# Patient Record
Sex: Male | Born: 1995 | Race: White | Hispanic: No | Marital: Single | Smoking: Current every day smoker
Health system: Southern US, Community
[De-identification: ages and names within clinical notes are randomized; demographics above are authoritative.]

## PROBLEM LIST (undated history)

## (undated) DIAGNOSIS — J45909 Unspecified asthma, uncomplicated: Secondary | ICD-10-CM

## (undated) DIAGNOSIS — Z800 Family history of malignant neoplasm of digestive organs: Secondary | ICD-10-CM

## (undated) DIAGNOSIS — Z803 Family history of malignant neoplasm of breast: Secondary | ICD-10-CM

## (undated) HISTORY — DX: Family history of malignant neoplasm of digestive organs: Z80.0

## (undated) HISTORY — DX: Family history of malignant neoplasm of breast: Z80.3

---
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
## 2015-03-12 IMAGING — CR DG CHEST 2V
3 series · 3 of 3 positions shown · non-contrast
Comparison: DG THORACIC SPINE W/SWIMMERS dated 05/03/2008; DG CHEST
2 VIEW dated 01/04/2008

CLINICAL DATA: Left-sided chest pain.  History of tobacco use

EXAM:
CHEST  2 VIEW

[w chest pa (1 of 2)]
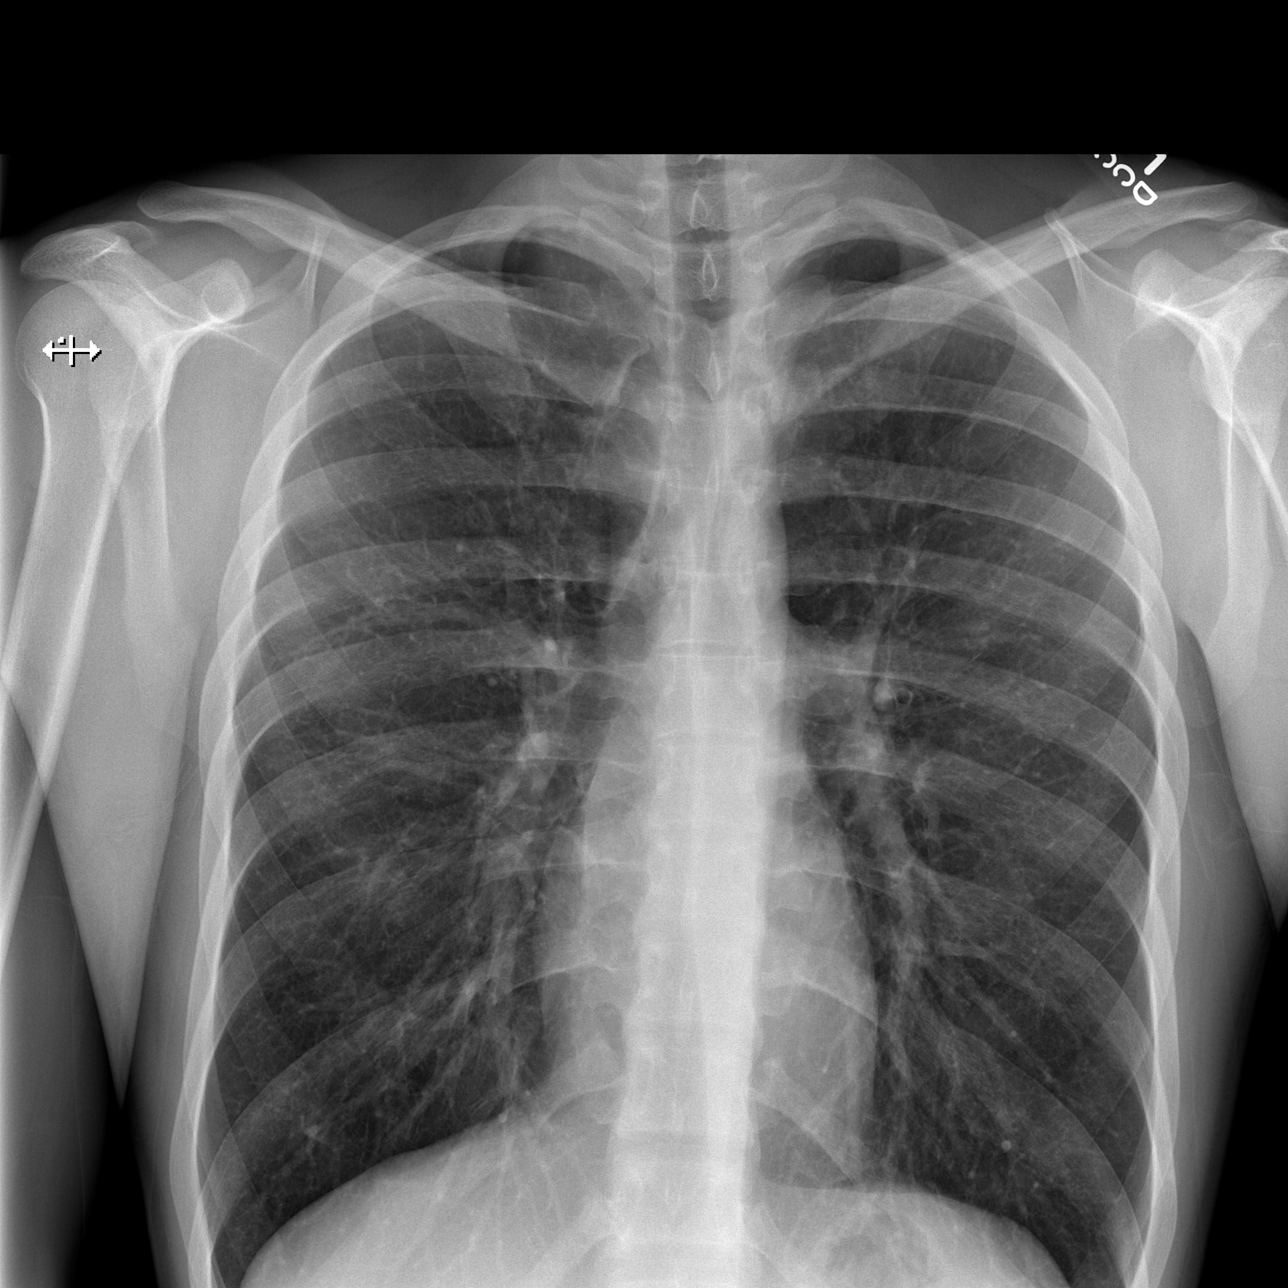

[w chest pa (2 of 2)]
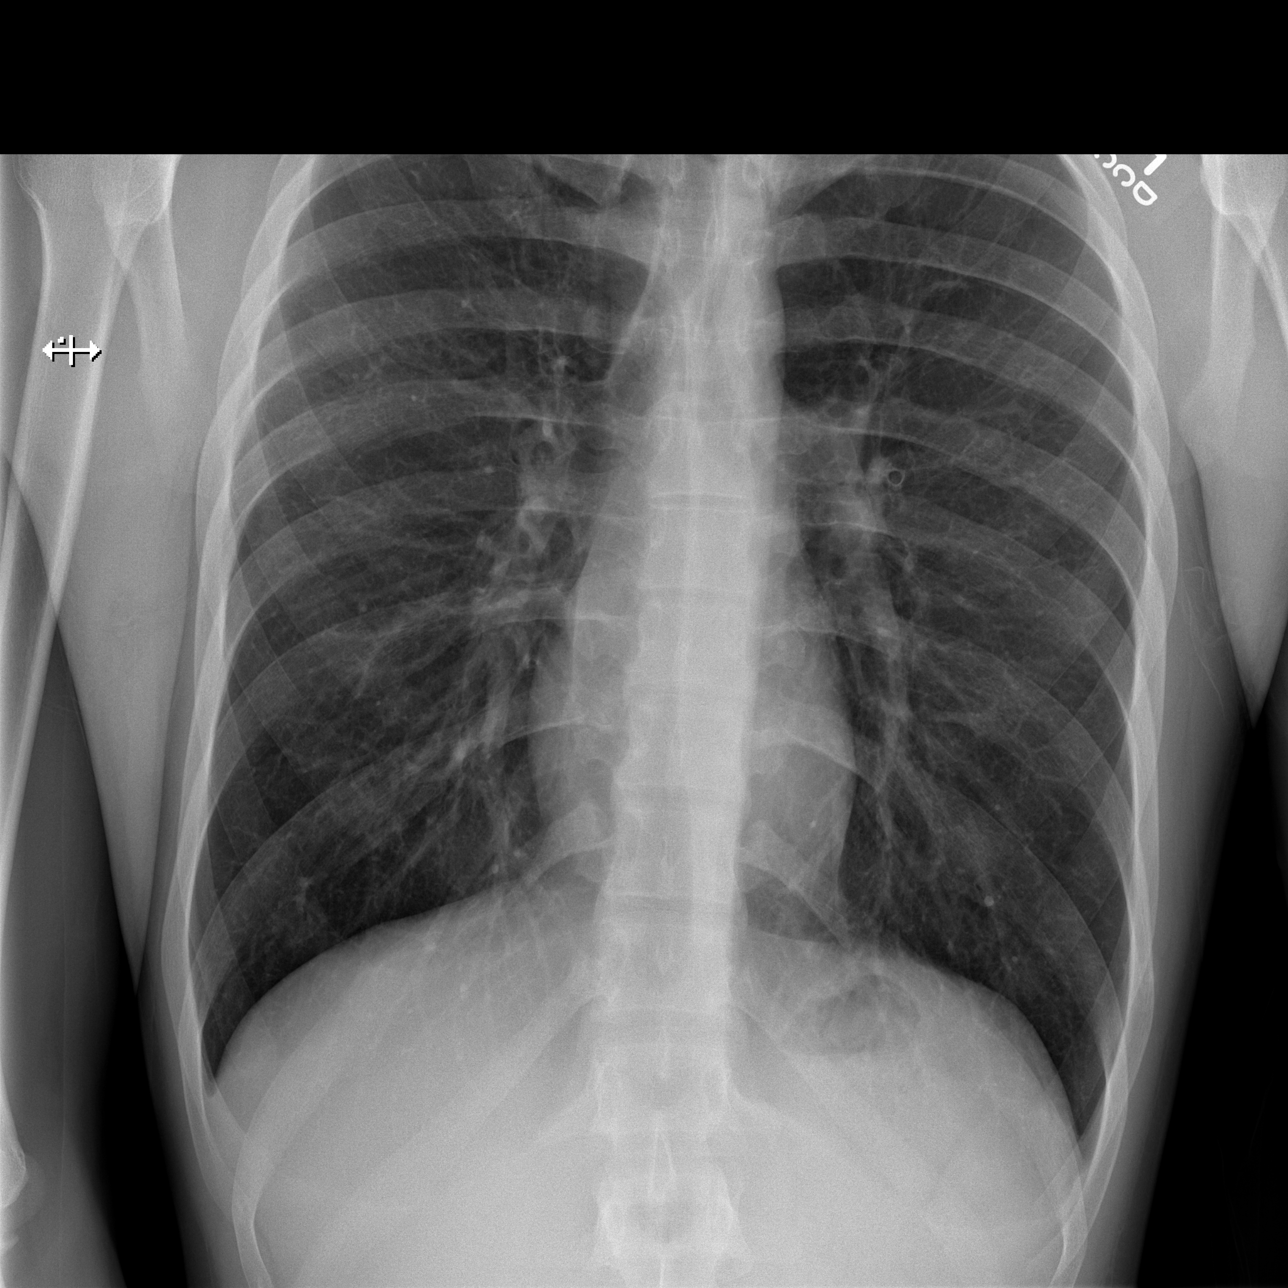

[w chest lat]
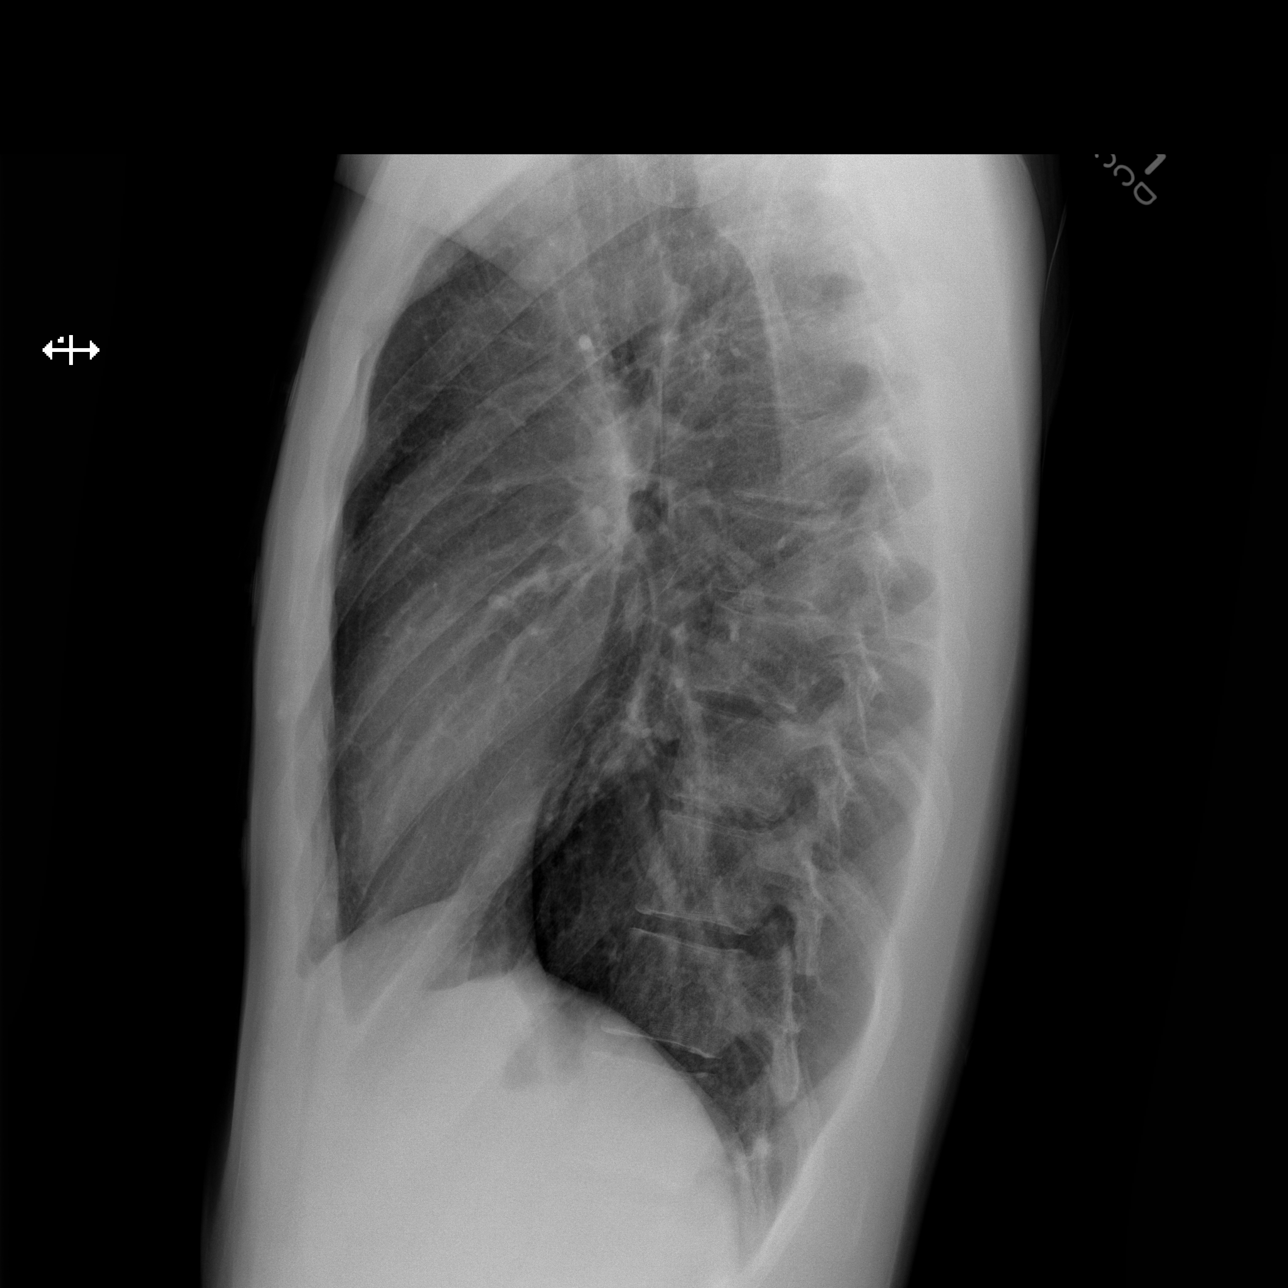

[3 of 3 positions shown; findings below may reference images not displayed]

FINDINGS: The lungs are chronically hyperinflated. There is no focal
infiltrate. There is no pleural effusion or pneumothorax or
pneumomediastinum. The cardiac silhouette is normal in size. The
pulmonary vascularity is not engorged. The mediastinum is normal in
width. The observed portions of the bony thorax appear normal.
IMPRESSION: There is hyperinflation consistent with reactive airway disease.
There is no evidence of pneumonia nor CHF or other acute
cardiopulmonary abnormality.

## 2018-12-12 ENCOUNTER — Other Ambulatory Visit: Payer: Self-pay

## 2018-12-12 DIAGNOSIS — Z20822 Contact with and (suspected) exposure to covid-19: Secondary | ICD-10-CM

## 2018-12-14 LAB — NOVEL CORONAVIRUS, NAA: SARS-CoV-2, NAA: NOT DETECTED

## 2019-01-17 ENCOUNTER — Other Ambulatory Visit: Payer: Self-pay

## 2019-01-17 DIAGNOSIS — Z20822 Contact with and (suspected) exposure to covid-19: Secondary | ICD-10-CM

## 2019-01-18 LAB — NOVEL CORONAVIRUS, NAA: SARS-CoV-2, NAA: NOT DETECTED

## 2019-09-25 ENCOUNTER — Inpatient Hospital Stay: Payer: Self-pay | Attending: Oncology

## 2019-09-25 ENCOUNTER — Encounter: Payer: Self-pay | Admitting: Genetic Counselor

## 2019-09-25 ENCOUNTER — Ambulatory Visit (HOSPITAL_BASED_OUTPATIENT_CLINIC_OR_DEPARTMENT_OTHER): Payer: Medicaid Other | Admitting: Genetic Counselor

## 2019-09-25 ENCOUNTER — Other Ambulatory Visit: Payer: Self-pay | Admitting: Genetic Counselor

## 2019-09-25 DIAGNOSIS — Z803 Family history of malignant neoplasm of breast: Secondary | ICD-10-CM | POA: Insufficient documentation

## 2019-09-25 DIAGNOSIS — Z800 Family history of malignant neoplasm of digestive organs: Secondary | ICD-10-CM | POA: Insufficient documentation

## 2019-09-25 NOTE — Progress Notes (Signed)
REFERRING PROVIDER: Mila Palmer, MD [address] [place],  [place] [address]  PRIMARY PROVIDER:  No primary care provider on file.  PRIMARY REASON FOR VISIT:  1. Family history of breast cancer   2. Family history of Lynch syndrome      HISTORY OF PRESENT ILLNESS:   Edward Hudson, a 24 y.o. male, was seen for a [facility] cancer genetics consultation at the request of Dr. Paulino Rily due to a family history of breast cancer and a diagnosis of Lynch syndrome in his maternal grandmother.  Edward Hudson presents to clinic today to discuss the possibility of a hereditary predisposition to cancer, genetic testing, and to further clarify his future cancer risks, as well as potential cancer risks for family members.   Edward Hudson is a 24 y.o. male with no personal history of cancer.  He attended today's visit with his grandmother who was receiving her test results of a diagnosis of Lynch syndrome.   CANCER HISTORY:  Oncology History   No history exists.     Past Medical History:  Diagnosis Date  . Asthma   . Family history of breast cancer   . Family history of Lynch syndrome     No past surgical history on file.  Social History   Socioeconomic History  . Marital status: Single    Spouse name: Not on file  . Number of children: Not on file  . Years of education: Not on file  . Highest education level: Not on file  Occupational History  . Not on file  Tobacco Use  . Smoking status: Current Every Day Smoker  Substance and Sexual Activity  . Alcohol use: No  . Drug use: Not on file  . Sexual activity: Not on file  Other Topics Concern  . Not on file  Social History Narrative  . Not on file   Social Determinants of Health   Financial Resource Strain:   . Difficulty of Paying Living Expenses:   Food Insecurity:   . Worried About Programme researcher, broadcasting/film/video in the Last Year:   . Barista in the Last Year:   Transportation Needs:   . Freight forwarder  (Medical):   Marland Kitchen Lack of Transportation (Non-Medical):   Physical Activity:   . Days of Exercise per Week:   . Minutes of Exercise per Session:   Stress:   . Feeling of Stress :   Social Connections:   . Frequency of Communication with Friends and Family:   . Frequency of Social Gatherings with Friends and Family:   . Attends Religious Services:   . Active Member of Clubs or Organizations:   . Attends Banker Meetings:   Marland Kitchen Marital Status:      FAMILY HISTORY:  We obtained a detailed, 4-generation family history.  Significant diagnoses are listed below: Family History  Problem Relation Age of Onset  . Breast cancer Maternal Grandmother 71       Metastatic breast cancer - MSH6+  . Heart disease Paternal Grandfather 25    The patient does not have children.  He has a paternal half brother who is cancer free.  Both parents are living.  The patient's mother is 13 and cancer free.  She is an only child.  Her mother was diagnosed with metastatic breast cancer at age 63.  Her mother also had breast cancer at age 71.  Genetic testing on his grandmother found an MSH6 pathogenic variant.  The  patient's father is living at 71.  The patient' does not know much information on his father's family.  He has three sisters but their health is unknown.  His mother is living and his father is deceased.  Edward Hudson is aware of previous family history of genetic testing for hereditary cancer risks. Patient's maternal ancestors are of English/Irish/Portuguese descent, and paternal ancestors are of Caucasian descent. There is no reported Ashkenazi Jewish ancestry. There is no known consanguinity.  GENETIC COUNSELING ASSESSMENT: Edward Hudson is a 24 y.o. male with a family history of cancer which is somewhat suggestive of a diagnosis of Lynch syndrome and predisposition to cancer given his grandmother's MSH6 pathogenic variant. We, therefore, discussed and recommended the following at today's visit.    DISCUSSION: Edward Hudson grandmother tested positive for Lynch syndrome based on a genetic variant in MSH6.  Based on her test result, Edward Hudson has a 25% chance of also testing positive.  We discussed Lynch syndrome and the screening that would be needed should he test positive.  IF he should test positive, we discussed that it would mean that his mother would also be positive.   We discussed that testing is beneficial for several reasons including knowing how to follow individuals and understand if other family members could be at risk for cancer and allow them to undergo genetic testing.   We reviewed the characteristics, features and inheritance patterns of hereditary cancer syndromes. We also discussed genetic testing, including the appropriate family members to test, the process of testing, insurance coverage and turn-around-time for results. We discussed the implications of a negative, positive, carrier and/or variant of uncertain significant result. We recommended Edward Hudson pursue genetic testing for the MSH6 and PDGFRA variants identified in his grandmother.     Based on Edward Hudson's family history of cancer, he meets medical criteria for genetic testing. He should not have an out of pocket cost for testing as he is getting tested through [facility]'s family variant testing program and he is within the 557 day policy period.  PLAN: After considering the risks, benefits, and limitations, Edward Hudson provided informed consent to pursue genetic testing and the blood sample was sent to [facility] for analysis of the MSH6 and PDGFRA genes. Results should be available within approximately 2-3 weeks' time, at which point they will be disclosed by telephone to Edward Hudson, as will any additional recommendations warranted by these results. Edward Hudson will receive a summary of his genetic counseling visit and a copy of his results once available. This information will also be available in Epic.   Lastly, we  encouraged Edward Hudson to remain in contact with cancer genetics annually so that we can continuously update the family history and inform him of any changes in cancer genetics and testing that may be of benefit for this family.   Edward Hudson's questions were answered to his satisfaction today. Our contact information was provided should additional questions or concerns arise. Thank you for the referral and allowing [place] to share in the care of your patient.   Jessice Madill P. Florene Glen, [place], [facility] Licensed, Insurance risk surveyor [email or web address] phone: [phone]  The patient was seen for a total of 30 minutes in face-to-face genetic counseling.  This patient was discussed with Drs. Magrinat, Lindi Adie and/or Burr Medico who agrees with the above.    _______________________________________________________________________ For Office Staff:  Number of people involved in session: 2 Was an Intern/ student involved with case: no

## 2019-09-26 LAB — GENETIC SCREENING ORDER

## 2019-10-05 ENCOUNTER — Encounter: Payer: Self-pay | Admitting: Genetic Counselor

## 2019-10-05 DIAGNOSIS — Z1379 Encounter for other screening for genetic and chromosomal anomalies: Secondary | ICD-10-CM | POA: Insufficient documentation

## 2019-10-06 ENCOUNTER — Telehealth: Payer: Self-pay | Admitting: Genetic Counselor

## 2019-10-06 NOTE — Telephone Encounter (Signed)
LM on VM that results are back and to please call. 

## 2019-10-10 ENCOUNTER — Ambulatory Visit: Payer: Self-pay | Admitting: Genetic Counselor

## 2019-10-10 DIAGNOSIS — Z1379 Encounter for other screening for genetic and chromosomal anomalies: Secondary | ICD-10-CM

## 2019-10-10 NOTE — Telephone Encounter (Signed)
Revealed that patient tested negative for the known MSH6 pathogenic variant identified in his grandmother.  Discussed that this is NOT informative for his mother and that she needs to be tested.  This is a true negative test, and therefore we know that he does not have Lynch syndrome and will not need to be followed more closely based on the family history.  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  There are no medical management changes recommended based on this variant.

## 2019-10-10 NOTE — Progress Notes (Signed)
HPI:  Edward Hudson was previously seen in the [place] clinic due to a family history of cancer, a known hereditary mutation in [facility] and concerns regarding a hereditary predisposition to cancer. Please refer to our prior cancer genetics clinic note for more information regarding our discussion, assessment and recommendations, at the time. Edward Hudson's recent genetic test results were disclosed to him, as were recommendations warranted by these results. These results and recommendations are discussed in more detail below.  CANCER HISTORY:  Oncology History   No history exists.    FAMILY HISTORY:  We obtained a detailed, 4-generation family history.  Significant diagnoses are listed below: Family History  Problem Relation Age of Onset  . Breast cancer Maternal Grandmother 71       Metastatic breast cancer - MSH6+  . Heart disease Paternal Grandfather 30    The patient does not have children.  He has a paternal half brother who is cancer free.  Both parents are living.  The patient's mother is 76 and cancer free.  She is an only child.  Her mother was diagnosed with metastatic breast cancer at age 56.  Her mother also had breast cancer at age 75.  Genetic testing on his grandmother found an MSH6 pathogenic variant.  The patient's father is living at 44.  The patient' does not know much information on his father's family.  He has three sisters but their health is unknown.  His mother is living and his father is deceased.  Edward Hudson is aware of previous family history of genetic testing for hereditary cancer risks. Patient's maternal ancestors are of English/Irish/Portuguese descent, and paternal ancestors are of Caucasian descent. There is no reported Ashkenazi Jewish ancestry. There is no known consanguinity.    GENETIC TEST RESULTS: We recommended Edward Hudson pursue testing for the familial hereditary cancer gene mutation called MSH6, c.2731C>T (p.Arg911*) . Edward Hudson test was  normal and did not reveal the familial mutation. We call this result a true negative result because the cancer-causing mutation was identified in Edward Hudson family, and he did not inherit it.  Given this negative result, Edward Hudson chances of developing Lynch syndrome-related cancers are the same as they are in the general population.       We discussed with Edward Hudson that because current genetic testing is not perfect, it is possible there may be a gene mutation in one of these genes that current testing cannot detect, but that chance is small.  We also discussed, that there could be another gene that has not yet been discovered, or that we have not yet tested, that is responsible for the cancer diagnoses in the family. It is also possible there is a hereditary cause for the cancer in the family that Edward Hudson did not inherit and therefore was not identified in his testing.  Therefore, it is important to remain in touch with cancer genetics in the future so that we can continue to offer Edward Hudson the most up to date genetic testing.   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  At this time, it is unknown if this variant is associated with increased cancer risk or if this is a normal finding, but most variants such as this get reclassified to being inconsequential. It should not be used to make medical management decisions. With time, we suspect the lab will determine the significance of this variant, if  any. If we do learn more about it, we will try to contact Edward Hudson to discuss it further. However, it is important to stay in touch with [place] periodically and keep the address and phone number up to date.  ADDITIONAL GENETIC TESTING: We discussed with Edward Hudson that there are other genes that are associated with increased cancer risk that can be analyzed. Should Edward Hudson wish to pursue additional genetic testing, we are  happy to discuss and coordinate this testing, at any time.    CANCER SCREENING RECOMMENDATIONS: Edward Hudson test result is considered negative (normal).  While reassuring, this does not definitively rule out a hereditary predisposition to cancer. It is still possible that there could be genetic mutations that are undetectable by current technology. There could be genetic mutations in genes that have not been tested or identified to increase cancer risk.  Therefore, it is recommended he continue to follow the cancer management and screening guidelines provided by his primary healthcare provider.   An individual's cancer risk and medical management are not determined by genetic test results alone. Overall cancer risk assessment incorporates additional factors, including personal medical history, family history, and any available genetic information that may result in a personalized plan for cancer prevention and surveillance.  RECOMMENDATIONS FOR FAMILY MEMBERS:  Individuals in this family might be at some increased risk of developing cancer, over the general population risk, simply due to the family history of cancer.  For instance, Edward Hudson negative test is not informative for his mother.  She still needs to be tested for the known pathogenic variant in MSH6 that was found in Edward Hudson grandmother.  We recommended women in this family have a yearly mammogram beginning at age 48, or 44 years younger than the earliest onset of cancer, an annual clinical breast exam, and perform monthly breast self-exams. Women in this family should also have a gynecological exam as recommended by their primary provider. All family members should be referred for colonoscopy starting at age 67, unless they test positive for Lynch syndrome, and at that point follow NCCN guidelines for Lynch syndrome screening..  FOLLOW-UP: Lastly, we discussed with Edward Hudson that cancer genetics is a rapidly advancing field and it is possible  that new genetic tests will be appropriate for him and/or his family members in the future. We encouraged him to remain in contact with cancer genetics on an annual basis so we can update his personal and family histories and let him know of advances in cancer genetics that may benefit this family.   Our contact number was provided. Edward Hudson's questions were answered to his satisfaction, and he knows he is welcome to call us at anytime with additional questions or concerns.   Roma Kayser, [place], [facility] Licensed, Certified Genetic Counselor [email or web address]
